# Patient Record
Sex: Female | Born: 1935 | Race: White | Hispanic: No
Health system: Southern US, Community
[De-identification: ages and names within clinical notes are randomized; demographics above are authoritative.]

---

## 1998-07-30 ENCOUNTER — Encounter: Payer: Self-pay | Admitting: General Surgery

## 1998-07-30 ENCOUNTER — Ambulatory Visit (HOSPITAL_COMMUNITY): Admission: RE | Admit: 1998-07-30 | Discharge: 1998-07-30 | Payer: Self-pay | Admitting: General Surgery

## 1998-11-20 ENCOUNTER — Encounter: Payer: Self-pay | Admitting: Hematology and Oncology

## 1998-11-20 ENCOUNTER — Encounter: Admission: RE | Admit: 1998-11-20 | Discharge: 1998-11-20 | Payer: Self-pay | Admitting: Hematology and Oncology

## 1998-12-17 ENCOUNTER — Ambulatory Visit (HOSPITAL_BASED_OUTPATIENT_CLINIC_OR_DEPARTMENT_OTHER): Admission: RE | Admit: 1998-12-17 | Discharge: 1998-12-17 | Payer: Self-pay | Admitting: General Surgery

## 1999-03-07 ENCOUNTER — Encounter: Admission: RE | Admit: 1999-03-07 | Discharge: 1999-03-07 | Payer: Self-pay | Admitting: Hematology and Oncology

## 1999-03-07 ENCOUNTER — Encounter: Payer: Self-pay | Admitting: Hematology and Oncology

## 2000-03-12 ENCOUNTER — Encounter: Payer: Self-pay | Admitting: Hematology and Oncology

## 2000-03-12 ENCOUNTER — Encounter: Admission: RE | Admit: 2000-03-12 | Discharge: 2000-03-12 | Payer: Self-pay | Admitting: Hematology and Oncology

## 2000-06-05 ENCOUNTER — Encounter: Payer: Self-pay | Admitting: Emergency Medicine

## 2000-06-05 ENCOUNTER — Emergency Department (HOSPITAL_COMMUNITY): Admission: EM | Admit: 2000-06-05 | Discharge: 2000-06-05 | Payer: Self-pay | Admitting: Emergency Medicine

## 2001-09-30 ENCOUNTER — Encounter: Payer: Self-pay | Admitting: Emergency Medicine

## 2001-09-30 ENCOUNTER — Inpatient Hospital Stay (HOSPITAL_COMMUNITY): Admission: EM | Admit: 2001-09-30 | Discharge: 2001-10-08 | Payer: Self-pay | Admitting: Emergency Medicine

## 2001-09-30 ENCOUNTER — Encounter: Payer: Self-pay | Admitting: Internal Medicine

## 2001-12-04 ENCOUNTER — Emergency Department (HOSPITAL_COMMUNITY): Admission: EM | Admit: 2001-12-04 | Discharge: 2001-12-04 | Payer: Self-pay | Admitting: Emergency Medicine

## 2001-12-04 ENCOUNTER — Encounter: Payer: Self-pay | Admitting: Emergency Medicine

## 2002-08-26 ENCOUNTER — Ambulatory Visit (HOSPITAL_COMMUNITY): Admission: RE | Admit: 2002-08-26 | Discharge: 2002-08-26 | Payer: Self-pay | Admitting: Gastroenterology

## 2002-10-21 ENCOUNTER — Encounter: Payer: Self-pay | Admitting: Emergency Medicine

## 2002-10-21 ENCOUNTER — Inpatient Hospital Stay (HOSPITAL_COMMUNITY): Admission: EM | Admit: 2002-10-21 | Discharge: 2002-10-24 | Payer: Self-pay | Admitting: Emergency Medicine

## 2002-12-12 ENCOUNTER — Inpatient Hospital Stay (HOSPITAL_COMMUNITY): Admission: EM | Admit: 2002-12-12 | Discharge: 2002-12-17 | Payer: Self-pay | Admitting: Emergency Medicine

## 2002-12-28 ENCOUNTER — Inpatient Hospital Stay (HOSPITAL_COMMUNITY): Admission: EM | Admit: 2002-12-28 | Discharge: 2003-01-06 | Payer: Self-pay | Admitting: Emergency Medicine

## 2003-01-11 ENCOUNTER — Inpatient Hospital Stay (HOSPITAL_COMMUNITY): Admission: AD | Admit: 2003-01-11 | Discharge: 2003-01-16 | Payer: Self-pay | Admitting: Internal Medicine

## 2003-01-20 ENCOUNTER — Inpatient Hospital Stay (HOSPITAL_COMMUNITY): Admission: EM | Admit: 2003-01-20 | Discharge: 2003-01-21 | Payer: Self-pay | Admitting: Emergency Medicine

## 2003-01-23 ENCOUNTER — Emergency Department (HOSPITAL_COMMUNITY): Admission: EM | Admit: 2003-01-23 | Discharge: 2003-01-23 | Payer: Self-pay | Admitting: Emergency Medicine

## 2003-01-24 ENCOUNTER — Ambulatory Visit (HOSPITAL_COMMUNITY): Admission: RE | Admit: 2003-01-24 | Discharge: 2003-01-24 | Payer: Self-pay | Admitting: Internal Medicine

## 2003-01-26 ENCOUNTER — Ambulatory Visit (HOSPITAL_COMMUNITY): Admission: RE | Admit: 2003-01-26 | Discharge: 2003-01-27 | Payer: Self-pay | Admitting: General Surgery

## 2003-02-07 ENCOUNTER — Ambulatory Visit (HOSPITAL_COMMUNITY): Admission: RE | Admit: 2003-02-07 | Discharge: 2003-02-07 | Payer: Self-pay | Admitting: Internal Medicine

## 2003-02-10 ENCOUNTER — Emergency Department (HOSPITAL_COMMUNITY): Admission: EM | Admit: 2003-02-10 | Discharge: 2003-02-10 | Payer: Self-pay | Admitting: Emergency Medicine

## 2003-02-12 ENCOUNTER — Inpatient Hospital Stay (HOSPITAL_COMMUNITY): Admission: EM | Admit: 2003-02-12 | Discharge: 2003-02-16 | Payer: Self-pay | Admitting: Emergency Medicine

## 2003-04-25 ENCOUNTER — Inpatient Hospital Stay (HOSPITAL_COMMUNITY): Admission: AD | Admit: 2003-04-25 | Discharge: 2003-05-01 | Payer: Self-pay | Admitting: Internal Medicine

## 2004-12-26 IMAGING — CT CT HEAD W/O CM
1 series · 15 of 30 positions shown, 19 images · non-contrast
Comparison: UNENHANCED CRANIAL CT [HOSPITAL] 12/04/01.

FINDINGS
CLINICAL DATA: RIGHT BODY WEAKNESS.
CRANIAL CT - WITHOUT CONTRAST
TECHNIQUE: 5 MM AXIAL IMAGES WERE OBTAINED FROM THE SKULL BASE THROUGH THE BRAIN TO THE VERTEX.

[Series 2: brain · axial · 0.47mm/px · z∈[+147,+290]mm · 15 of 30 slices shown, 19 images]
[im 2/30  brain]
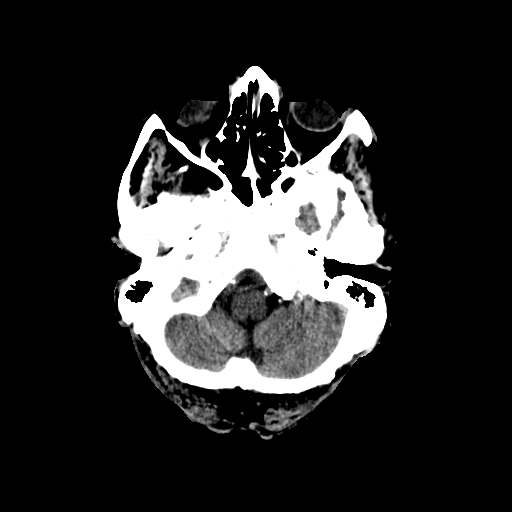
[im 2/30  bone]
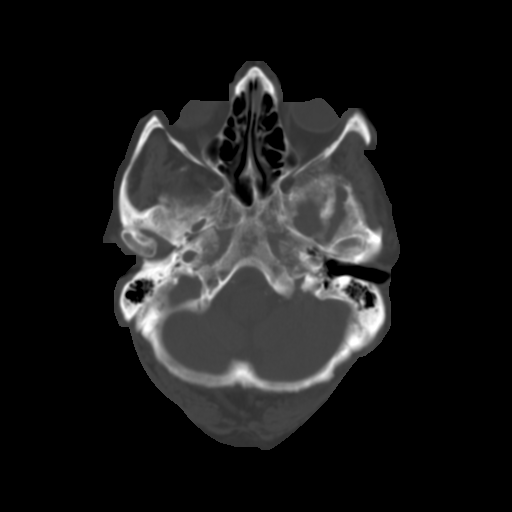
[im 4/30  brain]
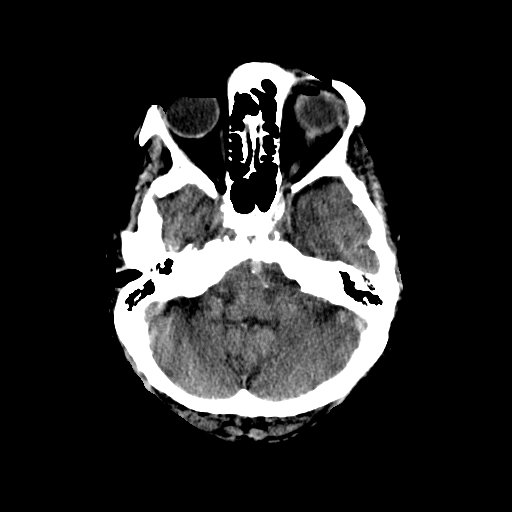
[im 6/30  brain]
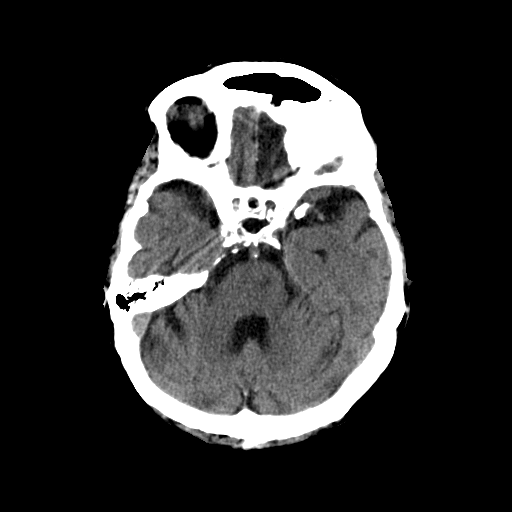
[im 8/30  brain]
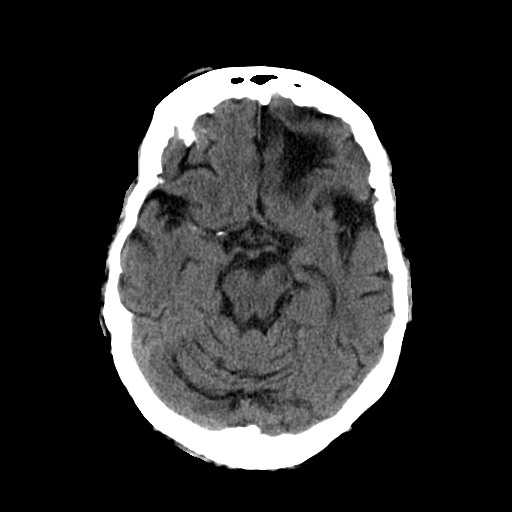
[im 10/30  brain]
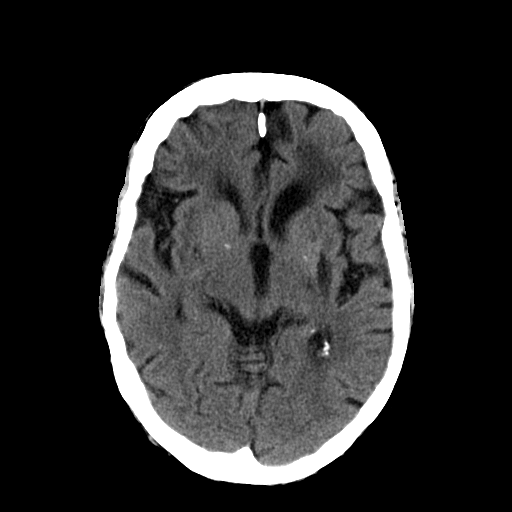
[im 10/30  bone]
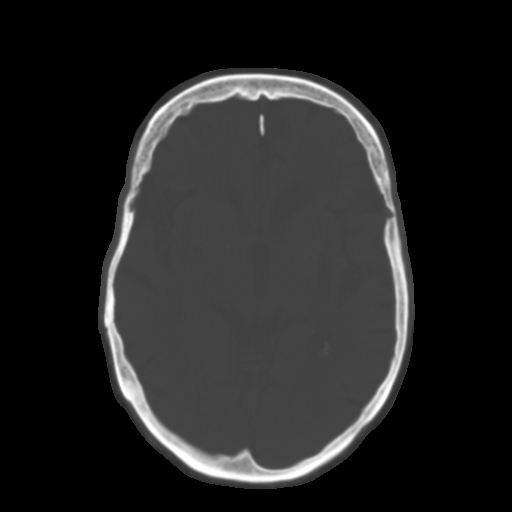
[im 12/30  brain]
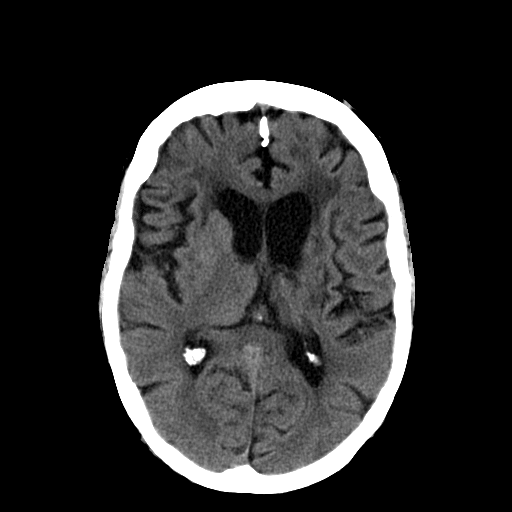
[im 14/30  brain]
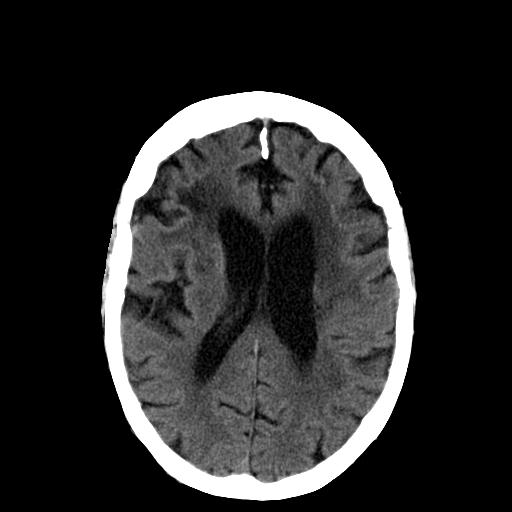
[im 16/30  brain]
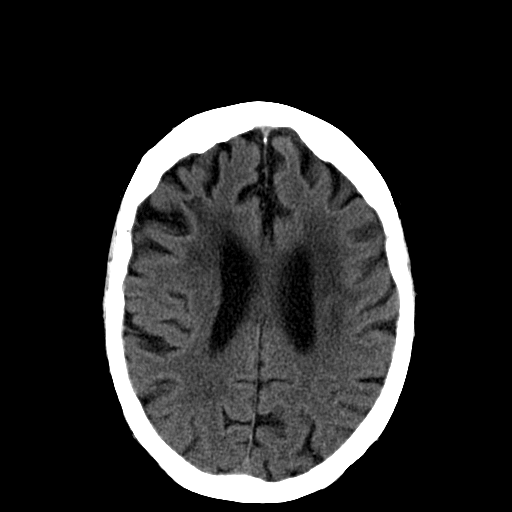
[im 17/30  brain]
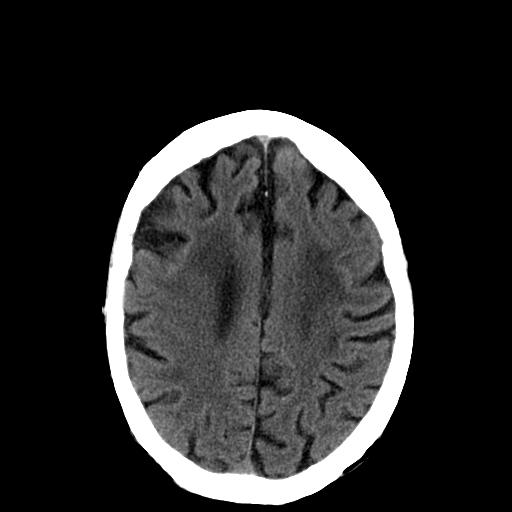
[im 17/30  bone]
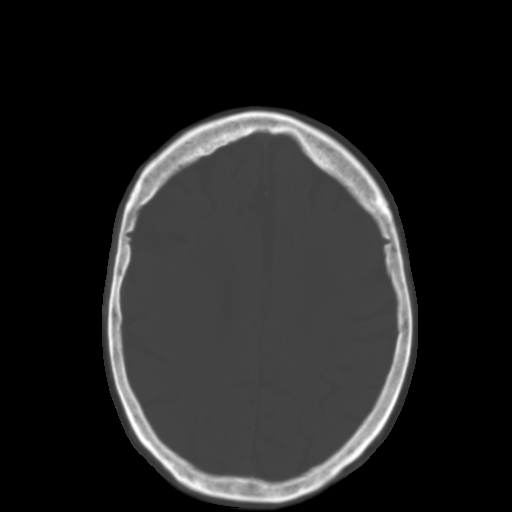
[im 19/30  brain]
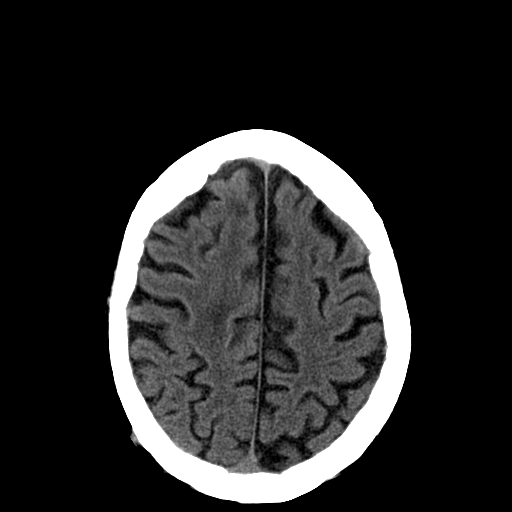
[im 21/30  brain]
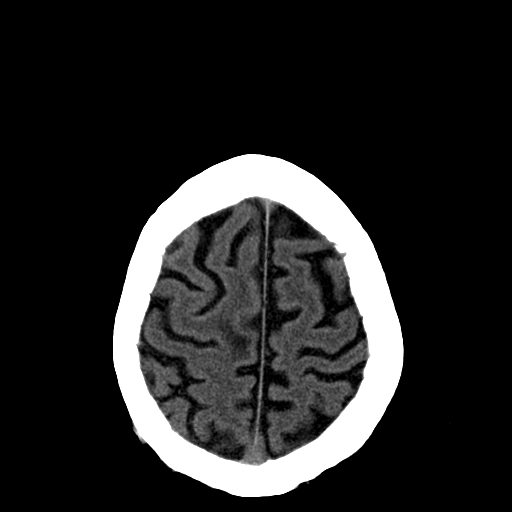
[im 23/30  brain]
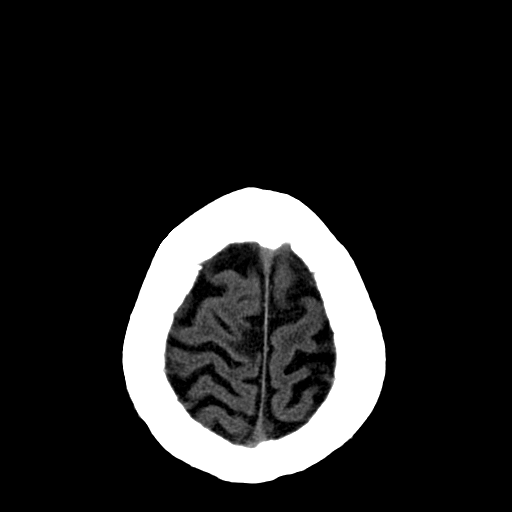
[im 25/30  brain]
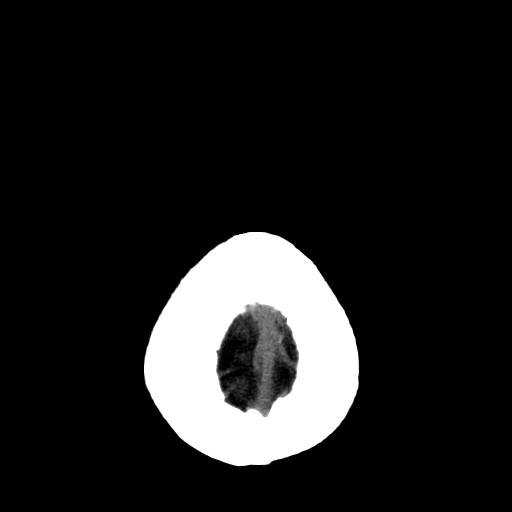
[im 25/30  bone]
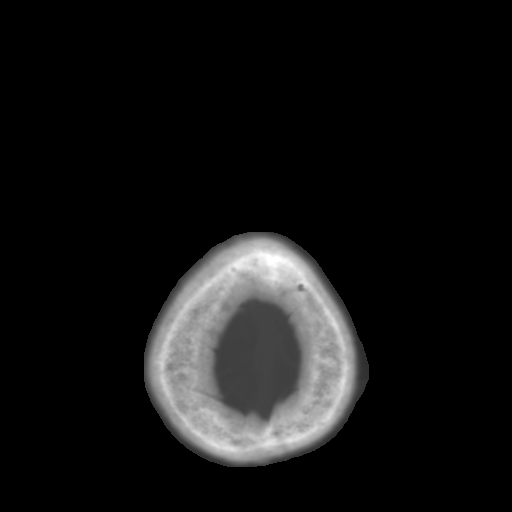
[im 27/30  brain]
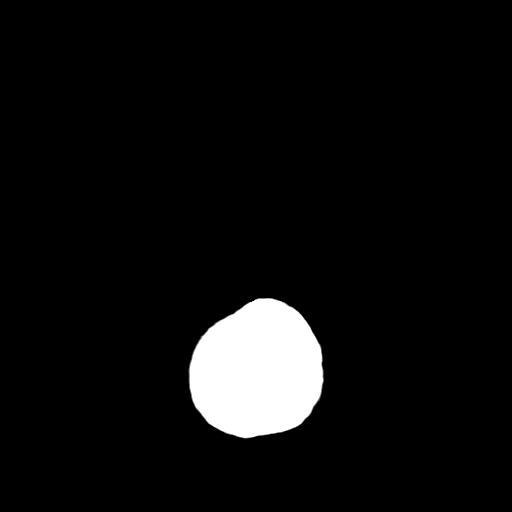
[im 29/30  brain]
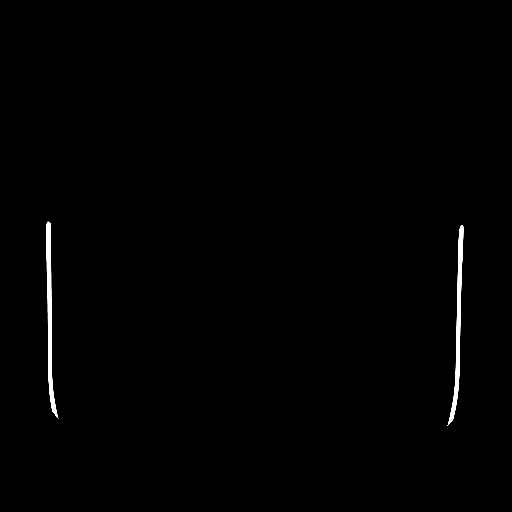

[15 of 30 positions shown; findings below may reference images not displayed]

FINDINGS: ENCEPHALOMALACIA IN THE LEFT FRONTAL LOBE IS CONSISTENT WITH AN OLD STROKE, UNCHANGED.  THERE ARE
ALSO OLD LACUNAR STROKES IN THE LEFT BASAL GANGLIA DIFFUSELY.  THERE IS AN AREA OF LOW ATTENUATION
IN THE RIGHT INFERIOR THALAMUS (IMAGE 9) WHICH WAS NOT PRESENT ON THE PREVIOUS EXAMINATION AND
COULD REFLECT AN ACUTE OR SUBACUTE LACUNAR STROKE.  SEVERE CHANGES OF SMALL VESSEL DISEASE OF THE
WHITE MATTER ARE PRESENT.  I DO NOT SEE EVIDENCE FOR AN ACUTE CORTICAL STROKE AT THIS TIME.  THERE
IS NO MASS EFFECT OR MIDLINE SHIFT.  THERE IS NO HEMORRHAGE OR HEMATOMA.  NO EXTRA-AXIAL FLUID
COLLECTIONS ARE IDENTIFIED.  NOTE IS MADE OF DENSE ATHEROSCLEROTIC CALCIFICATION OF THE VERTEBRAL
ARTERIES AND THE CAROTID ARTERY SIPHONS.  BONE WINDOW IMAGES DEMONSTRATE NO FOCAL OSSEOUS
ABNORMALITIES INVOLVING THE SKULL.  THE VISUALIZED PARANASAL SINUSES AND THE MASTOID AIR CELLS
APPEAR WELL AERATED.
IMPRESSION
1.  POSSIBLE ACUTE/SUBACUTE NONHEMORRHAGIC LACUNAR STROKE IN THE INFERIOR RIGHT THALAMUS.
2.  OLD LEFT FRONTAL CORTICAL STROKE AND OLD LACUNAR STROKES THROUGHOUT THE LEFT BASAL GANGLIA.
3.  MODERATE TO SEVERE DIFFUSE ATROPHY AND SEVERE CHANGES OF SMALL VESSEL DISEASE OF THE WHITE
MATTER.

## 2005-03-03 IMAGING — CT CT PELVIS W/ CM
1 series · 15 of 32 positions shown, 19 images · IV contrast (GASTROGRAFIN & [ID] [id number])
Comparison: none

CLINICAL DATA: 83-year-old with hypertension, vomiting, abdominal pain. 
 CT ABDOMEN AND PELVIS WITH CONTRAST
 Helical CT examination of the abdomen and pelvis was performed after the bolus infusion of a total of 100 cc Omnipaque 300 and the use of dilute oral contrast. 
 The lung bases are clear.  
 CT ABDOMEN
 There is mild diffuse fatty infiltration of the liver with more prominent focal fatty change in the falciform ligament.  A gallstone is noted in the gallbladder. Gallbladder wall is slightly enhancing.  There may be mild pericholecystic inflammatory change and could not exclude acute cholecystitis.  The pancreas is normal in appearance and the pancreatic and common bile ducts are normal in caliber.  The spleen is normal.  Adrenal glands are unremarkable.  Both kidneys have multiple cysts.  The stomach, duodenum, small bowel and colon are grossly normal.  Scattered surgical changes in the abdomen.  No mesenteric or retroperitoneal masses or adenopathy.  There is dense aortic calcification but no aneurysm or dissection. 
 IMPRESSION
 Borderline thickened enhancing gallbladder wall with mild pericholecystic inflammatory change may suggest acute cholecystitis.  Sonographic evaluation may be helpful.  There is a gallstone in the gallbladder. 
 Normal CT appearance of the pancreas and normal caliber of the pancreatic and common bile ducts. 
 Bilateral renal cysts. 
 Dense aortic calcifications but no aneurysm. 
 CT PELVIS
 It appears the patient has had a right colectomy.  The visualized small bowel loops are normal.  The uterus and right ovary have a normal appearance.  I do not see the left ovary for certain.  The rectum, sigmoid colon, and bladder appear normal other than some scattered colonic diverticula.  No free pelvic fluid collections or pelvic adenopathy. 
 Unremarkable CT pelvis.

[Series 2: abd pelvis · axial · 0.78mm/px · z∈[-432,-27]mm · 15 of 119 slices shown, 19 images]
[im 8/119  soft-tissue]
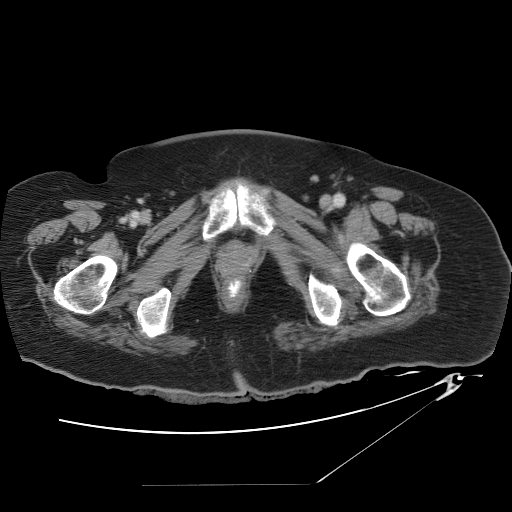
[im 8/119  bone]
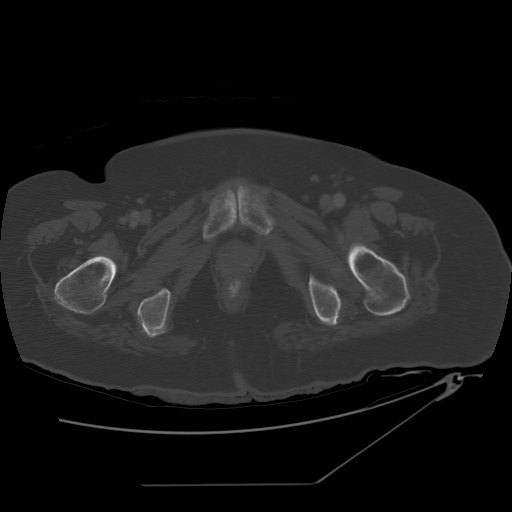
[im 16/119  soft-tissue]
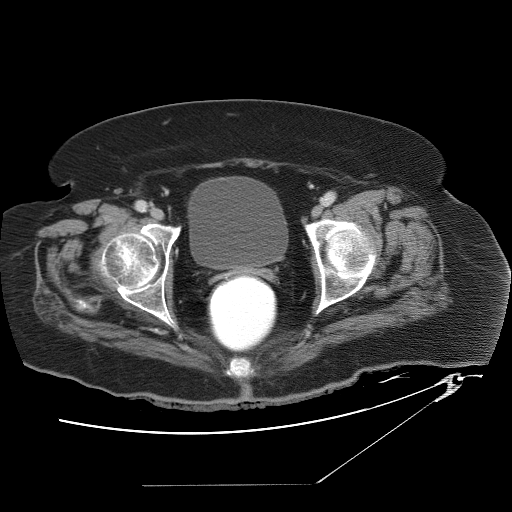
[im 23/119  soft-tissue]
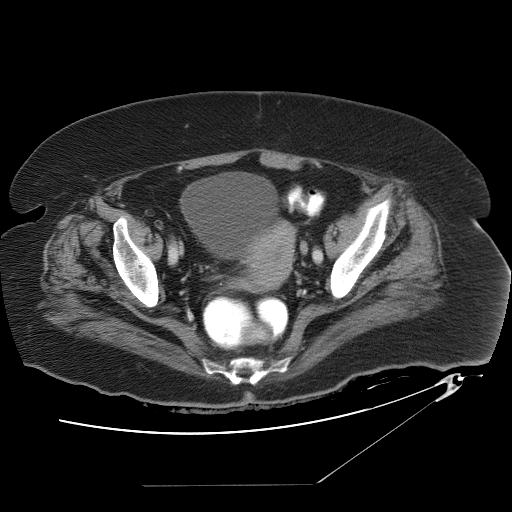
[im 35/119  soft-tissue]
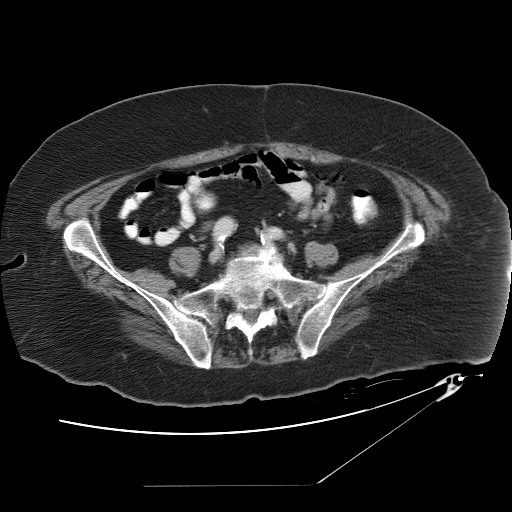
[im 42/119  soft-tissue]
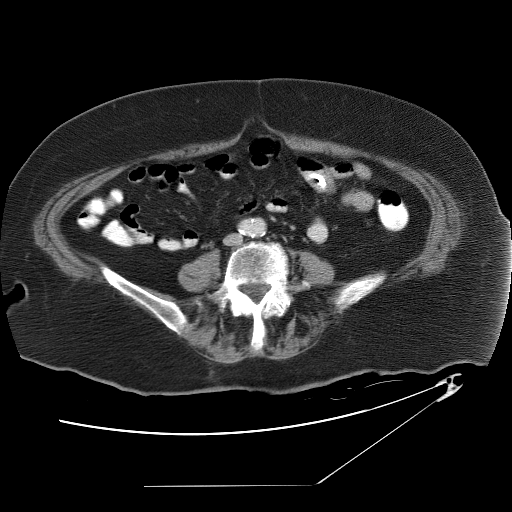
[im 50/119  soft-tissue]
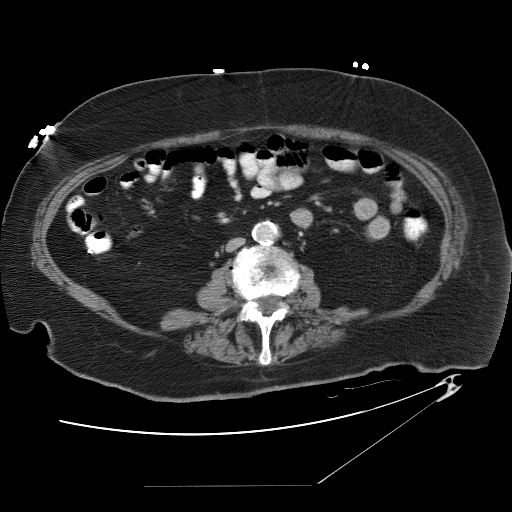
[im 61/119  soft-tissue]
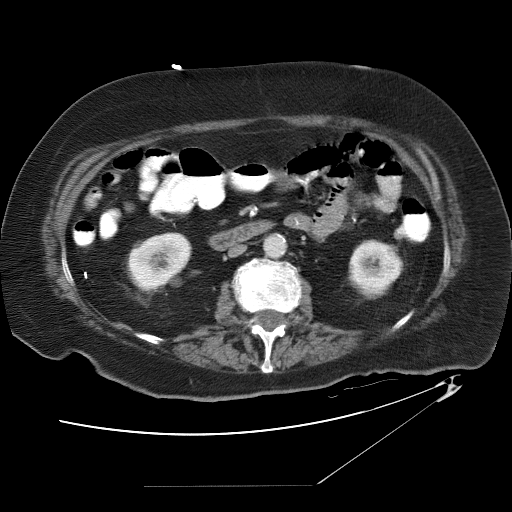
[im 69/119  soft-tissue]
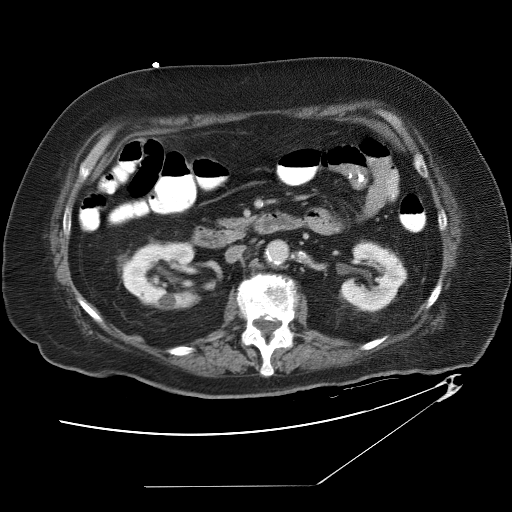
[im 77/119  soft-tissue]
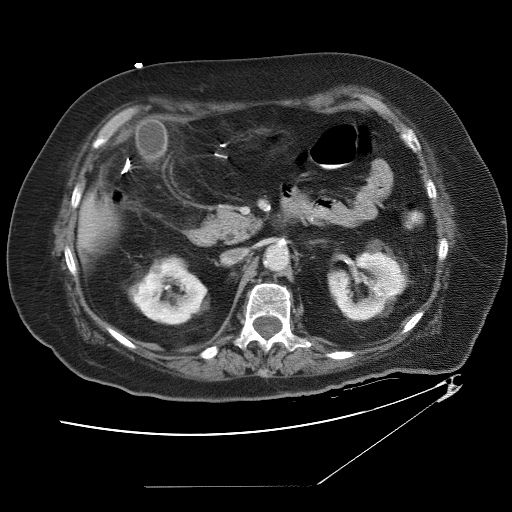
[im 77/119  bone]
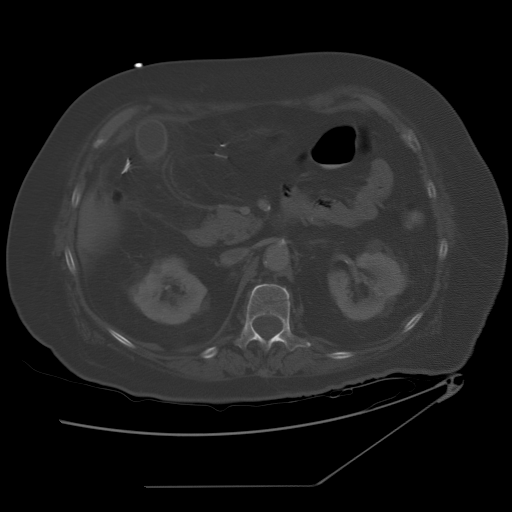
[im 84/119  soft-tissue]
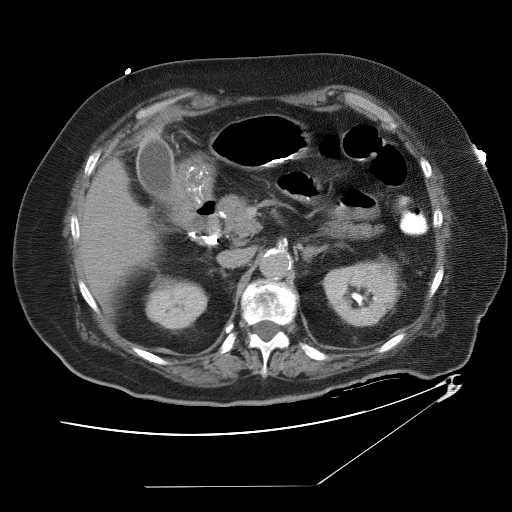
[im 96/119  soft-tissue]
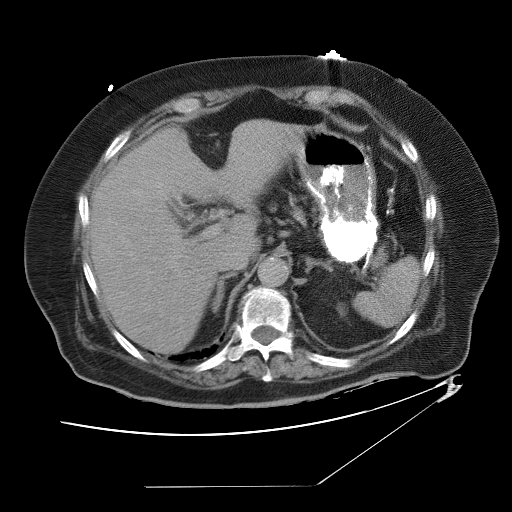
[im 103/119  soft-tissue]
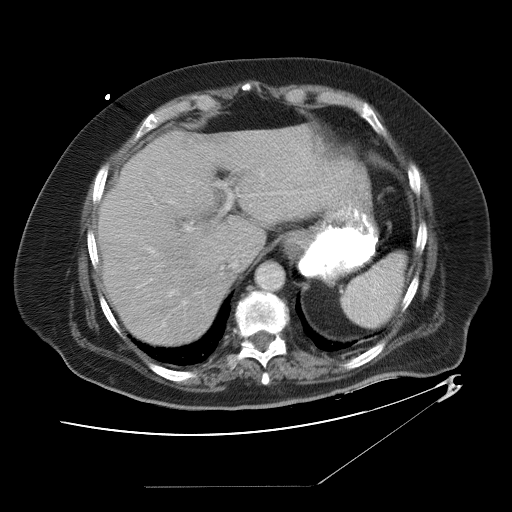
[im 103/119  lung]
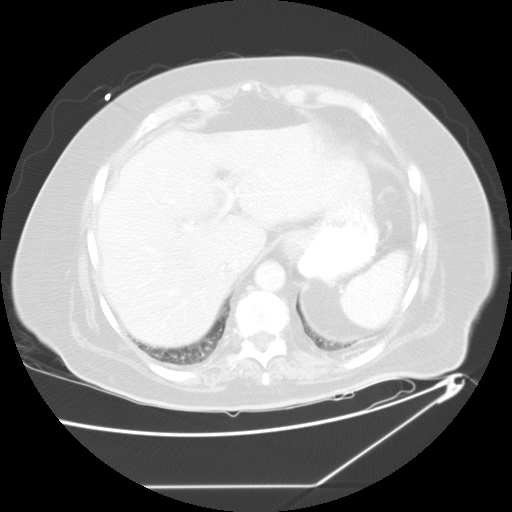
[im 107/119  lung]
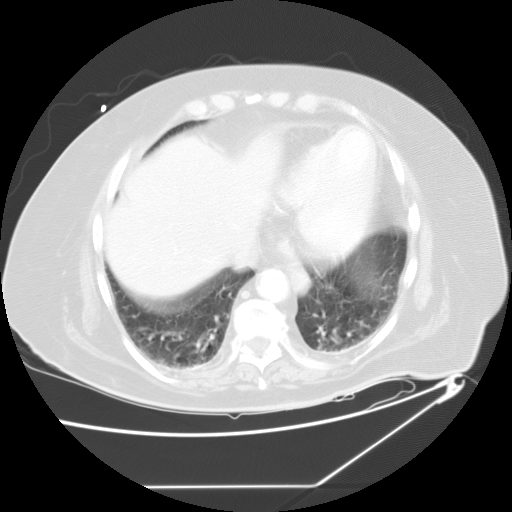
[im 111/119  soft-tissue]
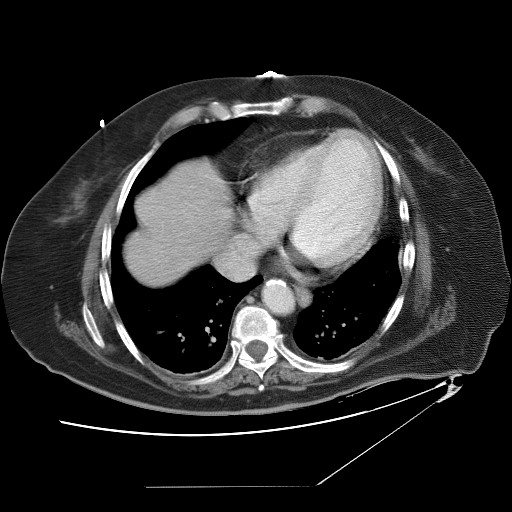
[im 111/119  lung]
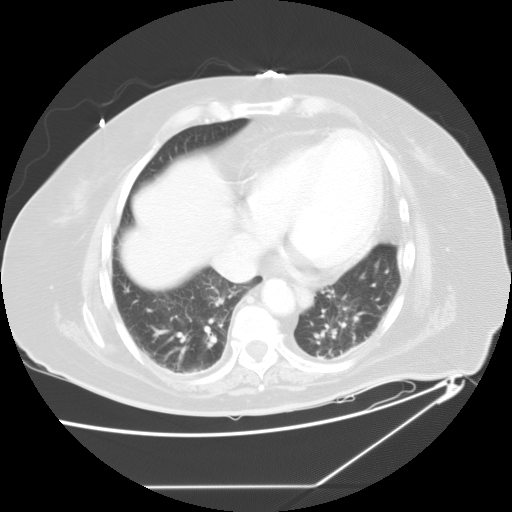
[im 115/119  lung]
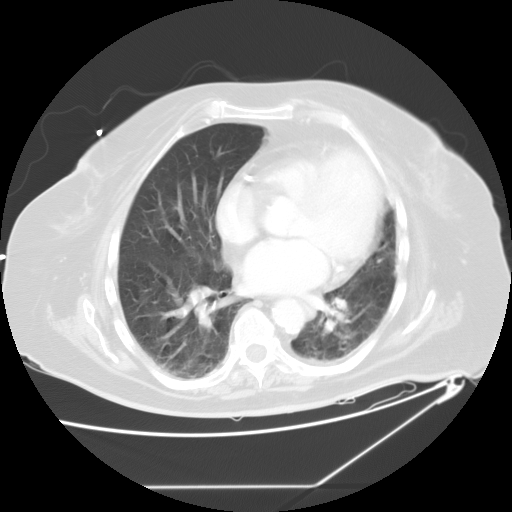

[15 of 32 positions shown; findings below may reference images not displayed]

## 2005-04-14 IMAGING — XA IR FLUORO RM 0-60 MIN
1 series · 3 of 3 positions shown · non-contrast
Comparison: none

CLINICAL DATA: History of cholecystitis.   Status post placement of cholecystostomy tube. The tube fell out last week and was replaced.  Now tube not draining.  
 CHOLECYSTOSTOMY TUBE CHECK/INJECTION
 Written informed consent was obtained from the patient?s caregiver for the procedure.  The patient was given 400 mg Cipro IV prior to the procedure. 
 The patient was placed supine on the angiography table and the indwelling catheter was prepped and draped in sterile fashion.  Fluoroscopy over the catheter shows that this is coiled in the right upper quadrant.  Contrast was injected through the catheter.  This shows that the catheter is not within the gallbladder. Contrast is seen freely flowing into the peritoneum.  Therefore the catheter was removed.  Dr. Tania Alkhaldi and Dr. Phumuzile Batt were notified.  It was decided not to replace the drainage catheter at this time. 
 IMPRESSION
 The patient?s cholecystostomy tube has again pulled out into the peritoneum.  The catheter was removed at this time.

[Series 1: run · 3 of 3 slices shown]
[im 1/3]
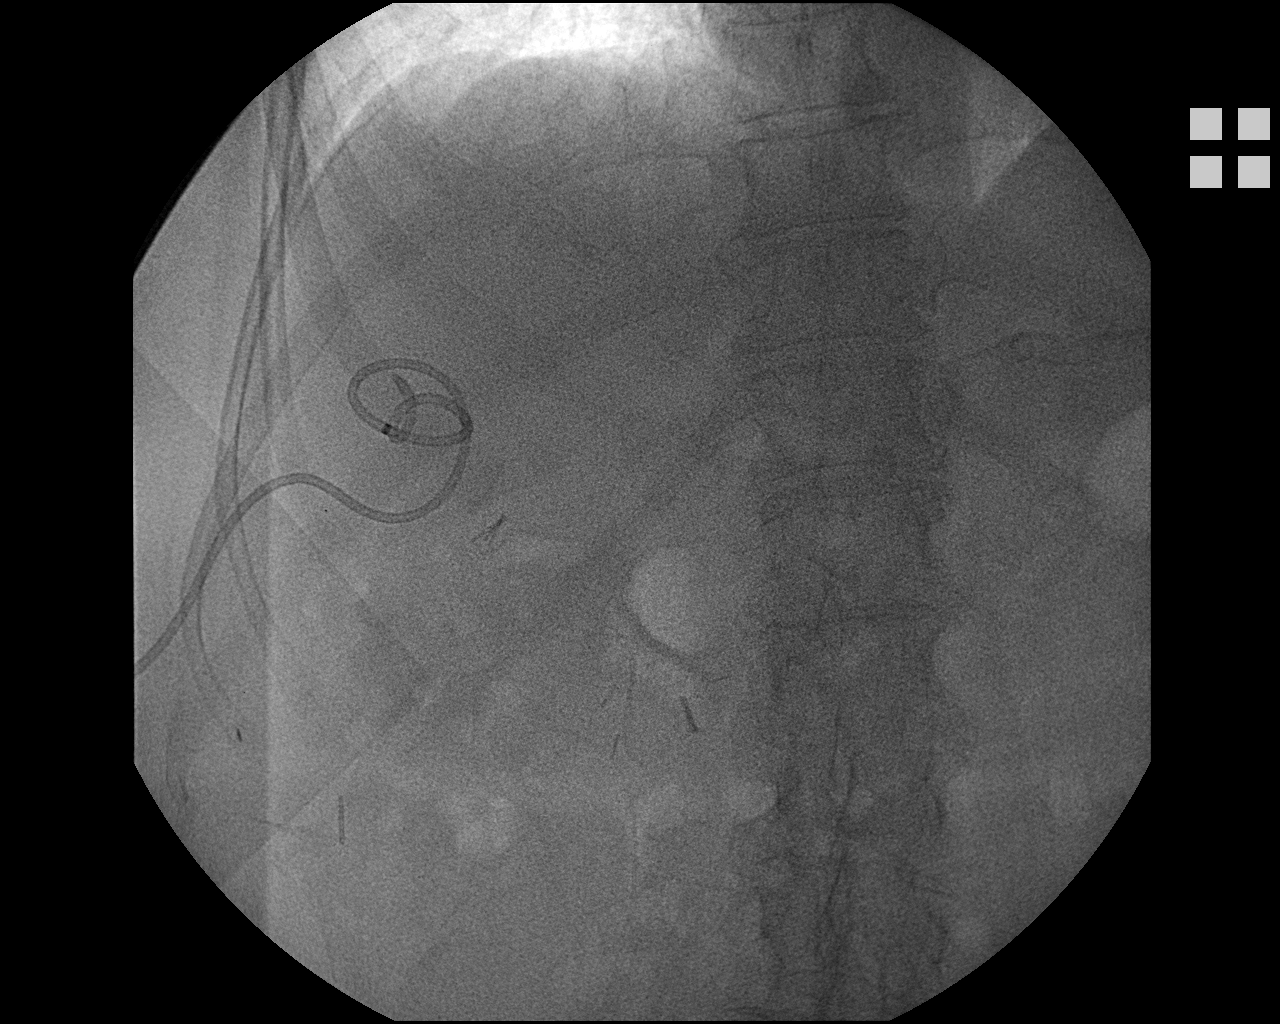
[im 2/3]
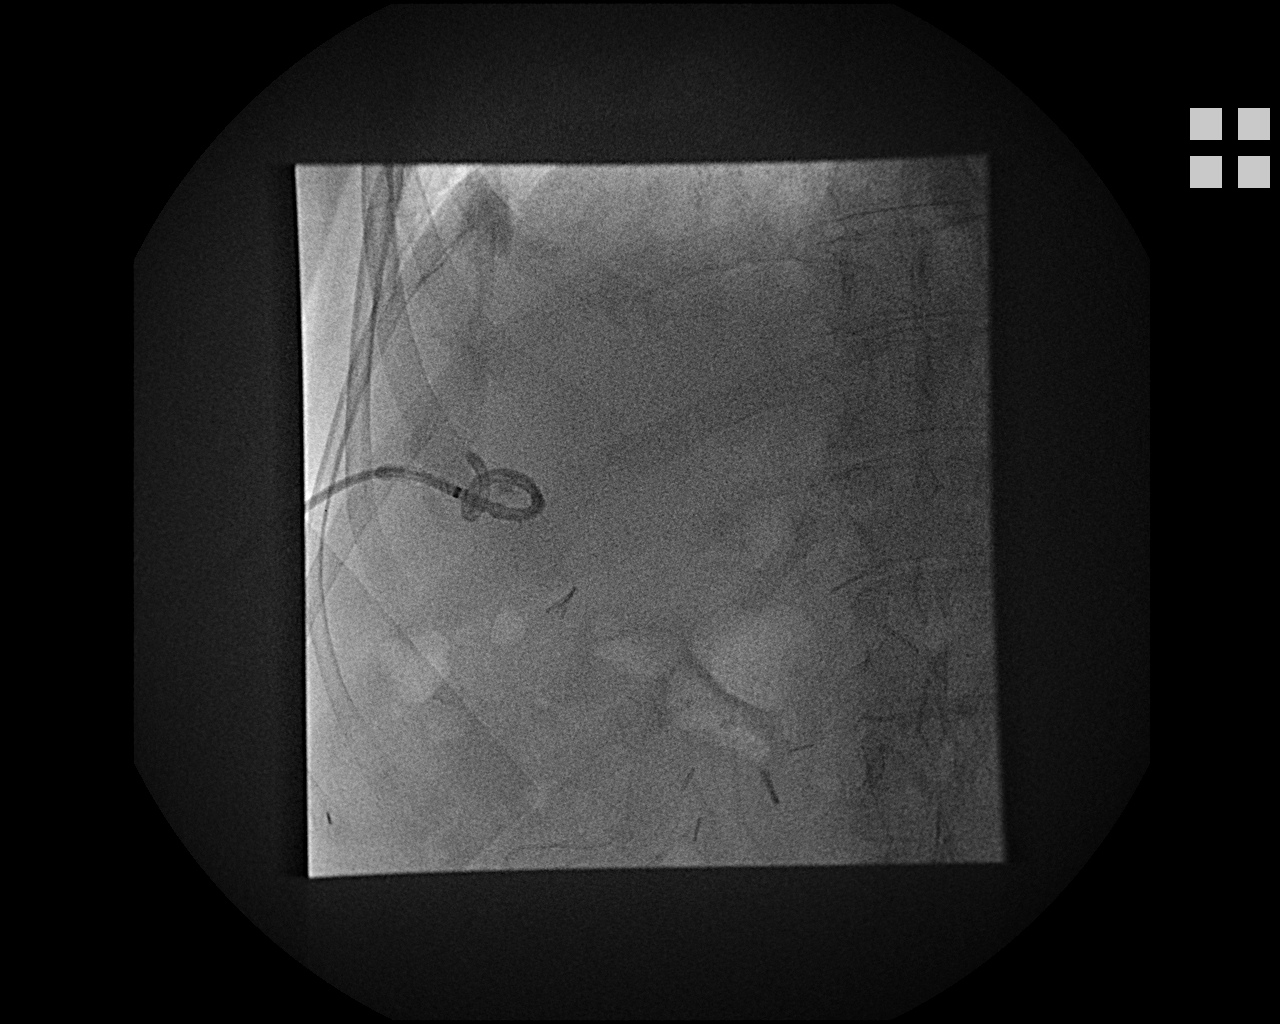
[im 3/3]
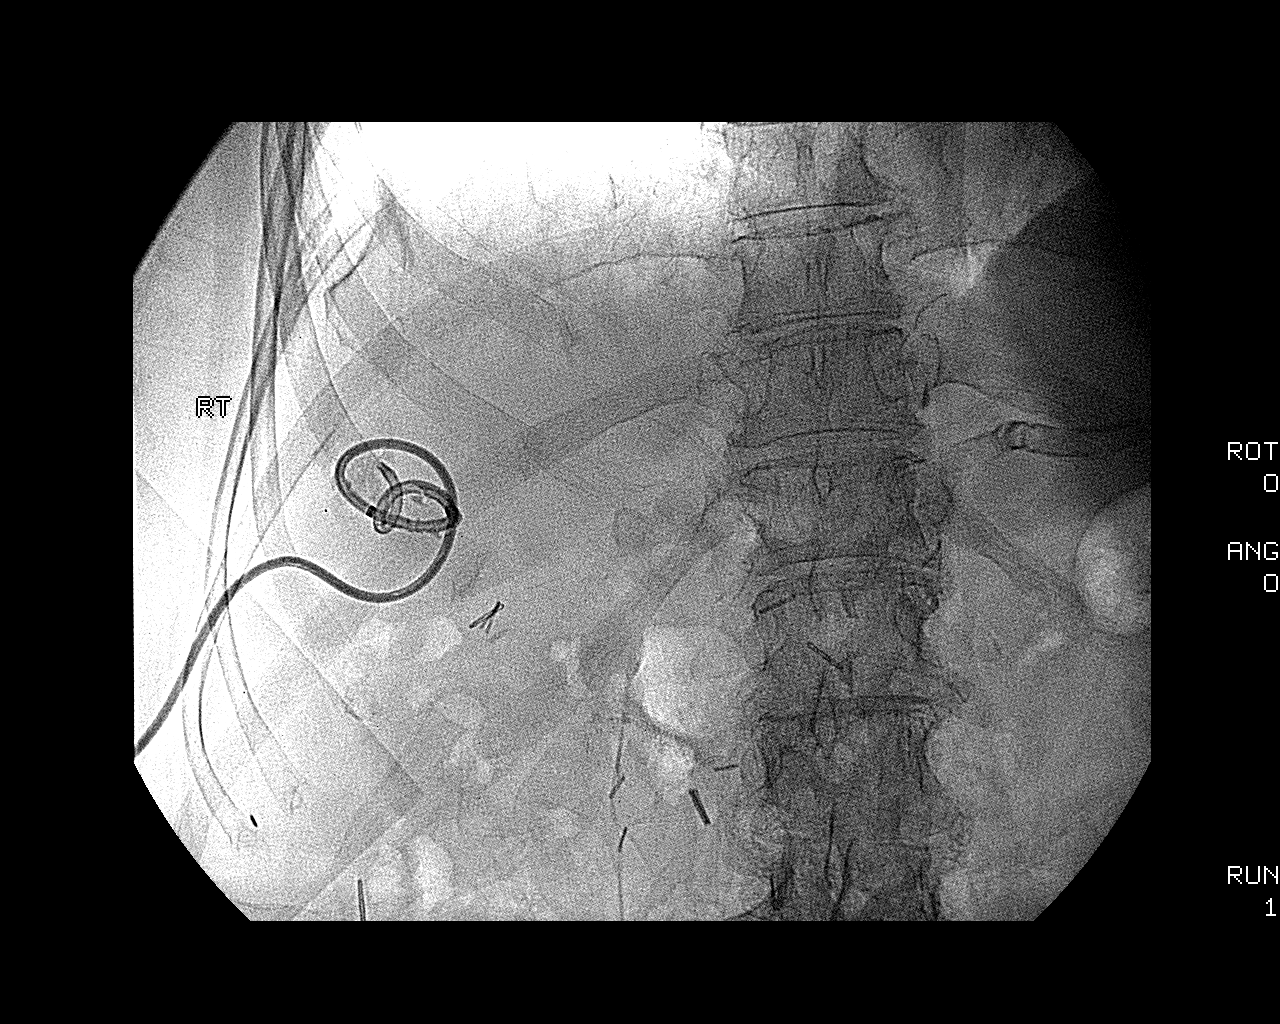

[3 of 3 positions shown; findings below may reference images not displayed]

## 2019-10-12 ENCOUNTER — Telehealth: Payer: Self-pay

## 2019-10-12 NOTE — Telephone Encounter (Signed)
Opened in error
